# Patient Record
Sex: Male | Born: 1990 | Race: White | Hispanic: No | Marital: Single | Smoking: Never smoker
Health system: Southern US, Community
[De-identification: ages and names within clinical notes are randomized; demographics above are authoritative.]

---
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
## 2009-01-06 IMAGING — CT CT HEAD W/O CM
1 series · 16 of 30 positions shown, 20 images · non-contrast
Comparison: none

CLINICAL DATA: Migraine headaches increasing in frequency for the past 4-5 months.
HEAD CT WITHOUT CONTRAST ? 06/11/07:
TECHNIQUE: Contiguous axial images were obtained from the base of the skull through the vertex according to standard protocol without contrast.

[Series 2: head_seq 4.5 h37s st · axial · 0.46mm/px · z∈[-145,+3]mm · 16 of 36 slices shown, 20 images]
[im 2/36  brain]
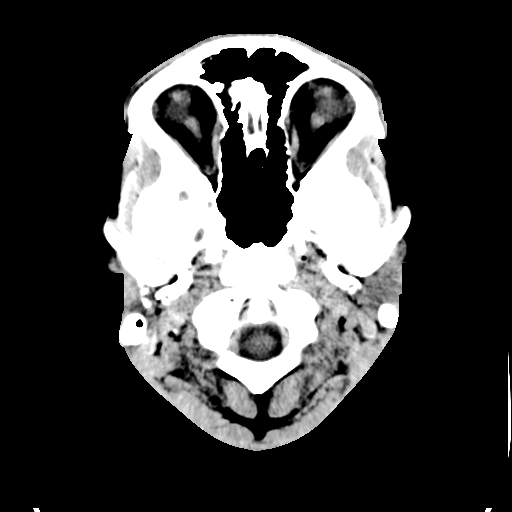
[im 2/36  bone]
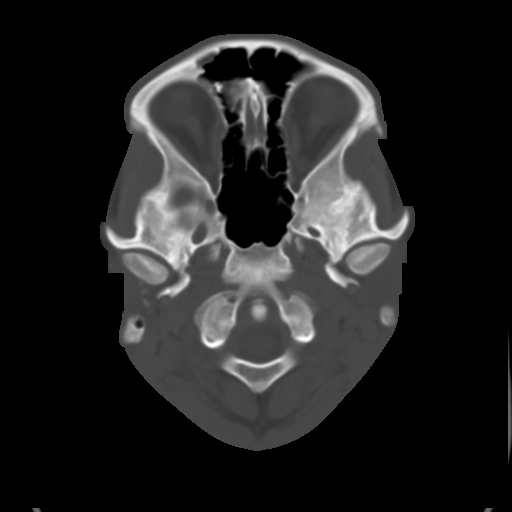
[im 4/36  brain]
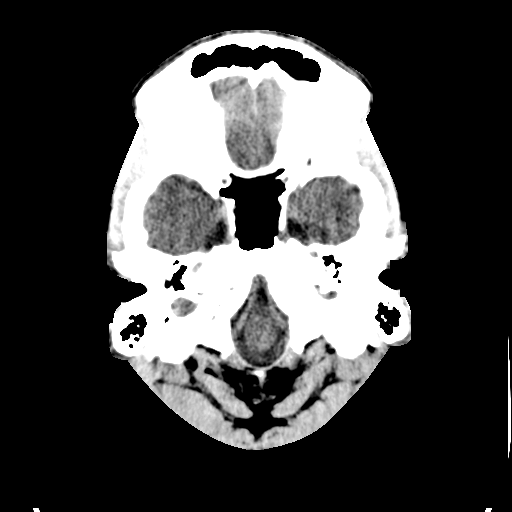
[im 7/36  brain]
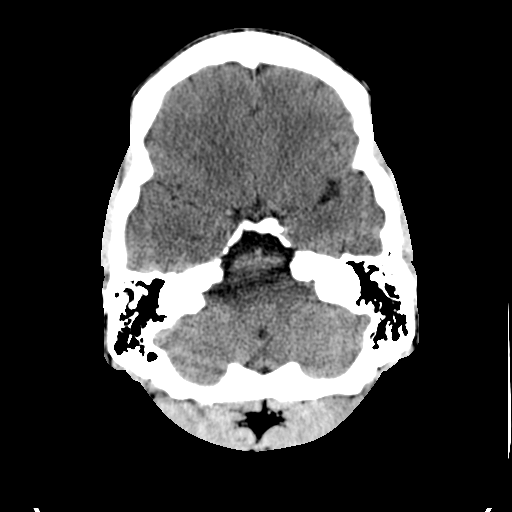
[im 9/36  brain]
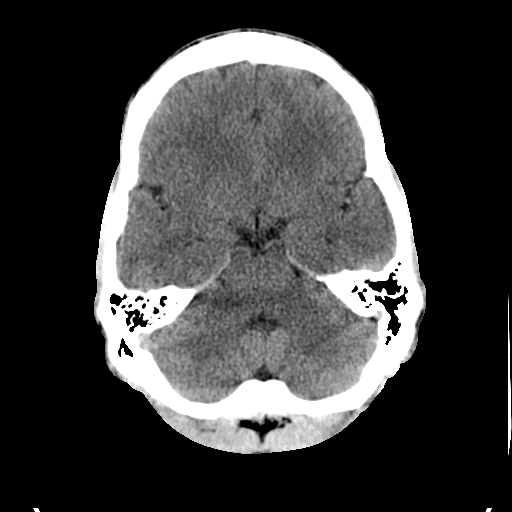
[im 10/36  brain]
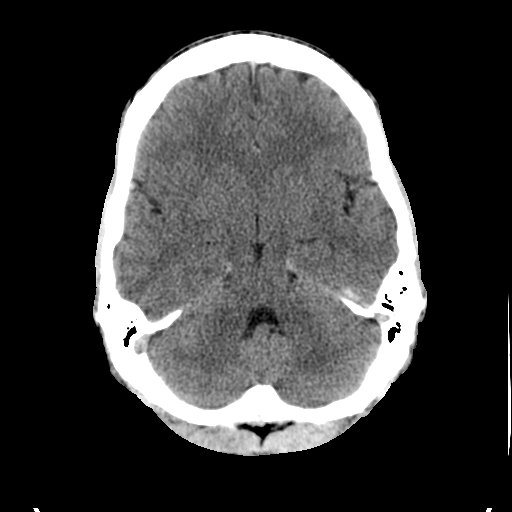
[im 10/36  bone]
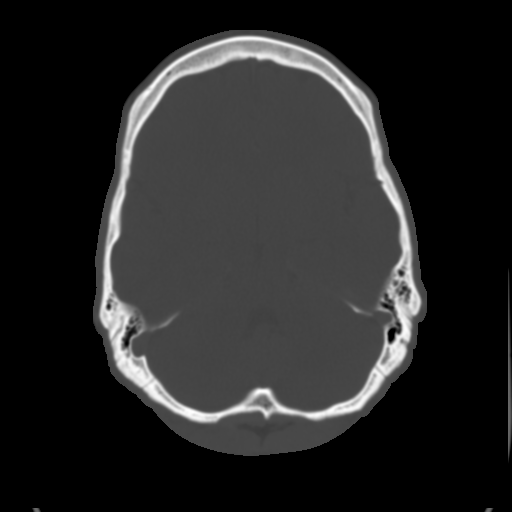
[im 13/36  brain]
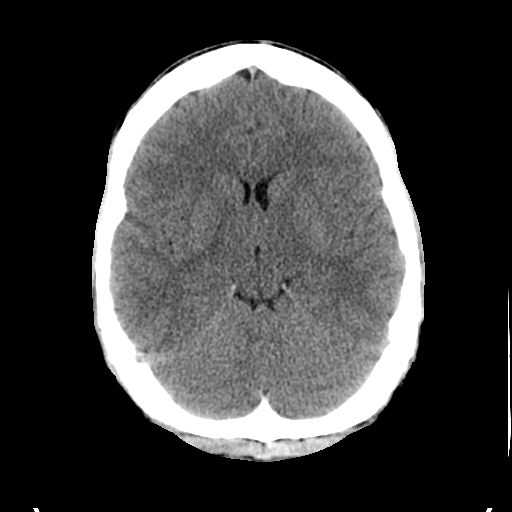
[im 15/36  brain]
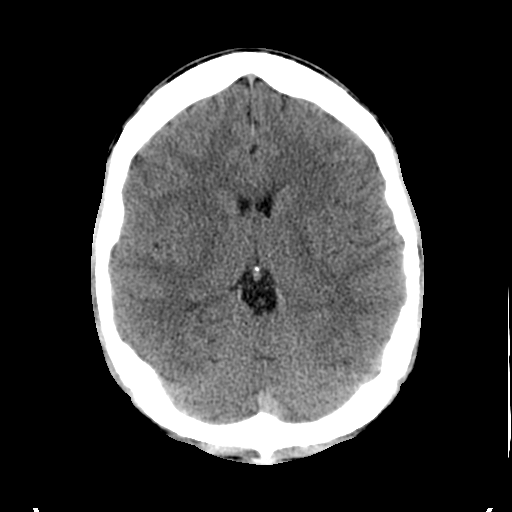
[im 17/36  brain]
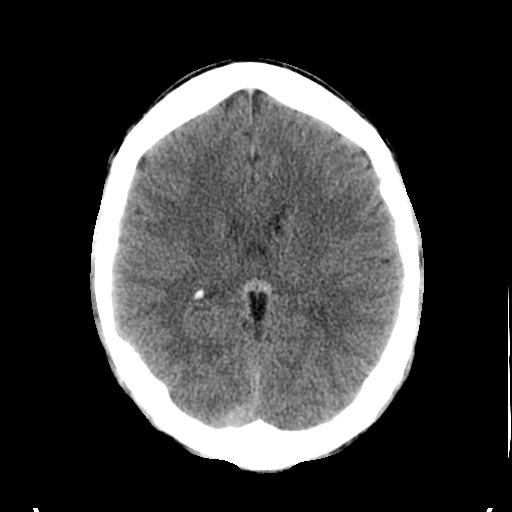
[im 19/36  brain]
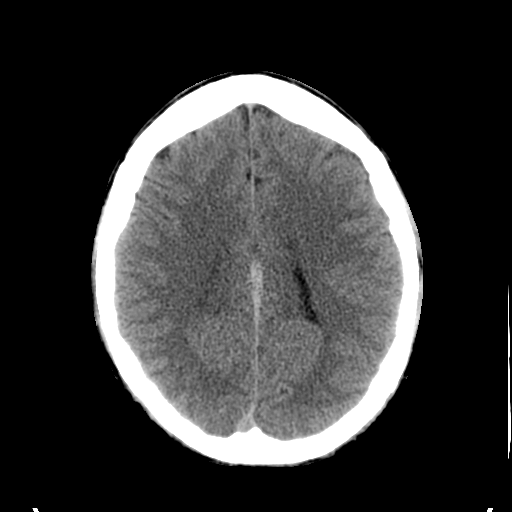
[im 19/36  bone]
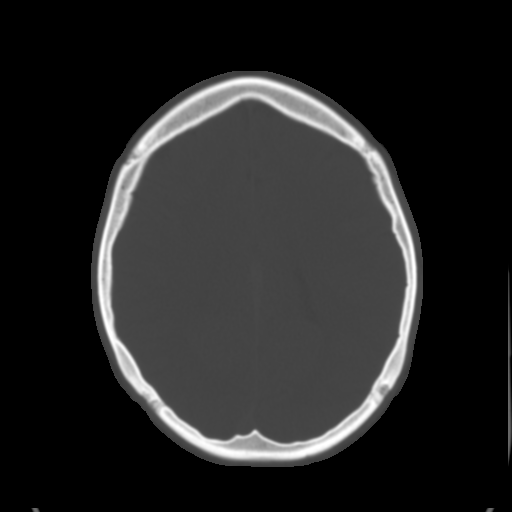
[im 21/36  brain]
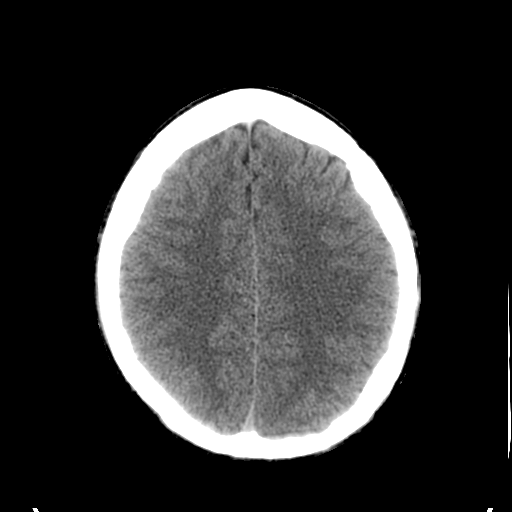
[im 23/36  brain]
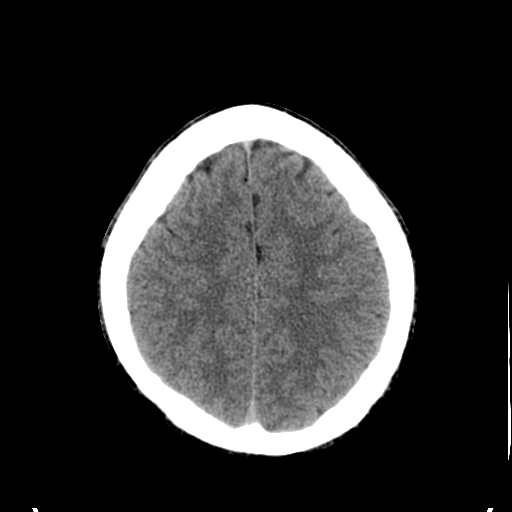
[im 26/36  brain]
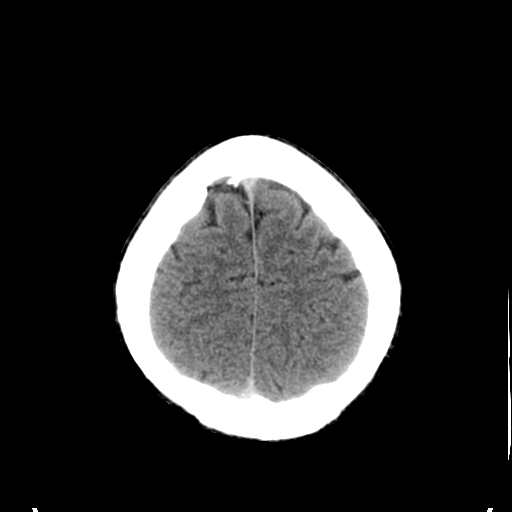
[im 27/36  brain]
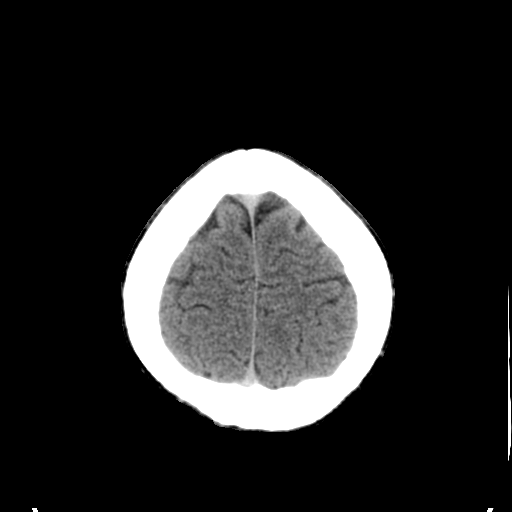
[im 27/36  bone]
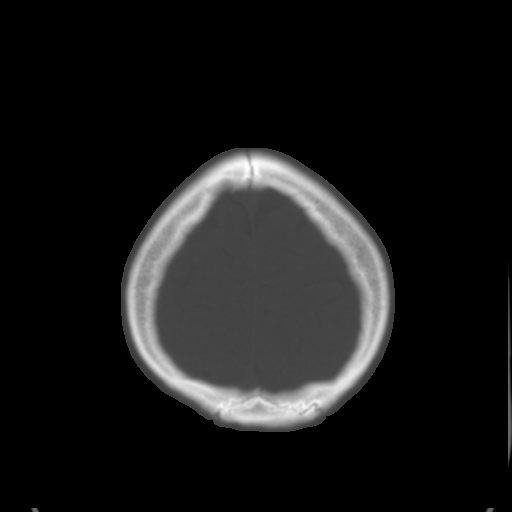
[im 29/36  brain]
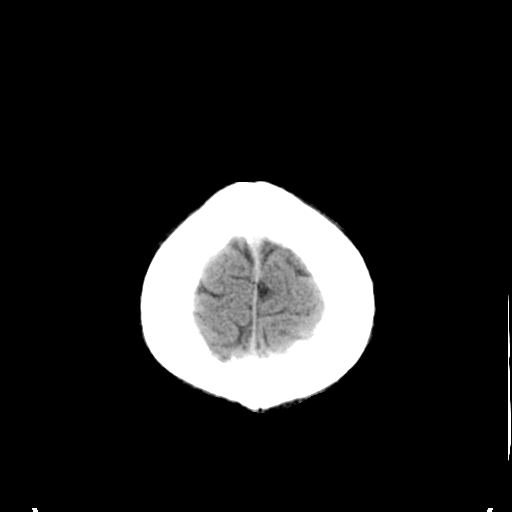
[im 32/36  brain]
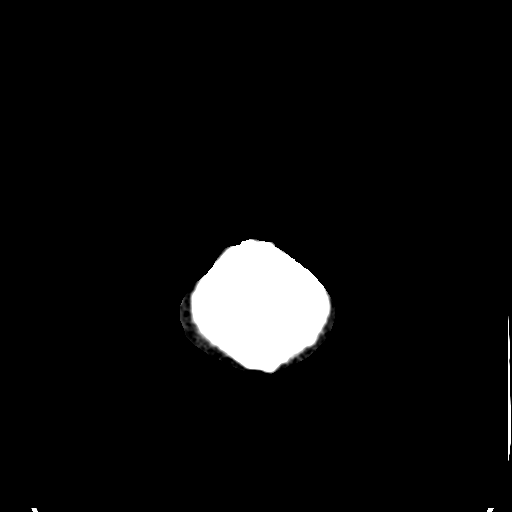
[im 34/36  brain]
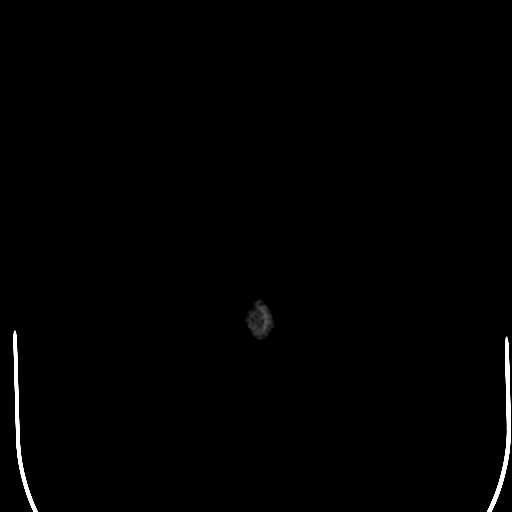

[16 of 30 positions shown; findings below may reference images not displayed]

FINDINGS: There is no evidence of intracranial hemorrhage, brain edema, acute infarct, mass lesion, or mass effect.  No other intra-axial abnormalities are seen, and the ventricles are within normal limits.  No abnormal extra-axial fluid collections or masses are identified.  No skull abnormalities are noted.
IMPRESSION: Negative non-contrast head CT.

## 2009-09-12 ENCOUNTER — Ambulatory Visit: Payer: Self-pay | Admitting: Gastroenterology

## 2009-11-08 ENCOUNTER — Encounter (INDEPENDENT_AMBULATORY_CARE_PROVIDER_SITE_OTHER): Payer: Self-pay | Admitting: *Deleted

## 2009-11-09 ENCOUNTER — Telehealth (INDEPENDENT_AMBULATORY_CARE_PROVIDER_SITE_OTHER): Payer: Self-pay | Admitting: *Deleted

## 2009-11-09 ENCOUNTER — Encounter (INDEPENDENT_AMBULATORY_CARE_PROVIDER_SITE_OTHER): Payer: Self-pay | Admitting: *Deleted

## 2009-11-10 ENCOUNTER — Ambulatory Visit: Payer: Self-pay | Admitting: Gastroenterology

## 2009-11-21 ENCOUNTER — Ambulatory Visit: Payer: Self-pay | Admitting: Gastroenterology

## 2010-05-31 ENCOUNTER — Ambulatory Visit: Payer: Self-pay | Admitting: Family Medicine

## 2010-05-31 DIAGNOSIS — H6690 Otitis media, unspecified, unspecified ear: Secondary | ICD-10-CM | POA: Insufficient documentation

## 2010-08-10 ENCOUNTER — Ambulatory Visit: Payer: Self-pay | Admitting: Family Medicine

## 2010-08-10 DIAGNOSIS — S0990XA Unspecified injury of head, initial encounter: Secondary | ICD-10-CM | POA: Insufficient documentation

## 2011-01-01 NOTE — Assessment & Plan Note (Signed)
Summary: sinus and ear inf/cjr   Vital Signs:  Patient profile:   20 year old male Weight:      172 pounds O2 Sat:      98 % Pulse rhythm:   regular BP sitting:   104 / 78  (left arm)  Vitals Entered By: Pura Spice, RN (May 31, 2010 2:28 PM) CC: Rt earache  allergies    History of Present Illness: complains throbbing disomfort rt ear over past week, increasing in severity no nasal congestion, no cough, no headache , no fever  Allergies (verified): No Known Drug Allergies  Past History:  Past Medical History: Last updated: 09/12/2009 asthma  Past Surgical History: Last updated: 09/12/2009 unremarkable  Social History: Last updated: 09/12/2009 Occupation: Student Patient has never smoked.  Alcohol Use - no Illicit Drug Use - no  Risk Factors: Smoking Status: never (09/12/2009)  Review of Systems      See HPI  The patient denies anorexia, fever, weight loss, weight gain, vision loss, decreased hearing, hoarseness, chest pain, syncope, dyspnea on exertion, peripheral edema, prolonged cough, headaches, hemoptysis, abdominal pain, melena, hematochezia, severe indigestion/heartburn, hematuria, incontinence, genital sores, muscle weakness, suspicious skin lesions, transient blindness, difficulty walking, depression, unusual weight change, abnormal bleeding, enlarged lymph nodes, angioedema, breast masses, and testicular masses.    Physical Exam  General:  Well-developed,well-nourished,in no acute distress; alert,appropriate and cooperative throughout examination Head:  Normocephalic and atraumatic without obvious abnormalities. No apparent alopecia or balding. Eyes:  No corneal or conjunctival inflammation noted. EOMI. Perrla. Funduscopic exam benign, without hemorrhages, exudates or papilledema. Vision grossly normal. Ears:  rt TM erythematous and dull, left ear negativce Nose:  External nasal examination shows no deformity or inflammation. Nasal mucosa are pink and  moist without lesions or exudates. Mouth:  Oral mucosa and oropharynx without lesions or exudates.  Teeth in good repair. Lungs:  Normal respiratory effort, chest expands symmetrically. Lungs are clear to auscultation, no crackles or wheezes. Heart:  Normal rate and regular rhythm. S1 and S2 normal without gallop, murmur, click, rub or other extra sounds.   Impression & Recommendations:  Problem # 1:  OTITIS MEDIA, ACUTE, RIGHT (ICD-382.9) Assessment New  His updated medication list for this problem includes:    Amoxicillin 500 Mg Caps (Amoxicillin) .Marland Kitchen... 1 stat then 1 morn, midaft and hs  Complete Medication List: 1)  Albuterol 90 Mcg/act Aers (Albuterol) .Marland Kitchen.. 1 or 2 inhalations every 4-6 as needed for wheezing 2)  Adderall Xr 30 Mg Xr24h-cap (Amphetamine-dextroamphetamine) 3)  Amoxicillin 500 Mg Caps (Amoxicillin) .Marland Kitchen.. 1 stat then 1 morn, midaft and hs  Patient Instructions: 1)  otitis media 2)  amoxicillin 500 mg three times a day Prescriptions: AMOXICILLIN 500 MG CAPS (AMOXICILLIN) 1 stat then 1 morn, midaft and hs  #30 x 0   Entered and Authorized by:   Judithann Sheen MD   Signed by:   Judithann Sheen MD on 05/31/2010   Method used:   [facility]        [facility] [address]       [address]       [place], [place]  [address]       Ph: [phone] or [phone]       Fax: [phone]   RxID:   [id number]
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
## 2011-07-02 ENCOUNTER — Encounter: Payer: Self-pay | Admitting: Family Medicine

## 2011-07-03 ENCOUNTER — Ambulatory Visit (INDEPENDENT_AMBULATORY_CARE_PROVIDER_SITE_OTHER): Payer: BC Managed Care – PPO | Admitting: Family Medicine

## 2011-07-03 ENCOUNTER — Encounter: Payer: Self-pay | Admitting: Family Medicine

## 2011-07-03 DIAGNOSIS — N63 Unspecified lump in unspecified breast: Secondary | ICD-10-CM

## 2011-07-03 DIAGNOSIS — F988 Other specified behavioral and emotional disorders with onset usually occurring in childhood and adolescence: Secondary | ICD-10-CM

## 2011-07-03 DIAGNOSIS — N6310 Unspecified lump in the right breast, unspecified quadrant: Secondary | ICD-10-CM

## 2011-07-03 NOTE — Patient Instructions (Signed)
You do have a mass under the right nipple. We will get a an ultrasound and then decide on plan of treatment

## 2011-07-03 NOTE — Progress Notes (Signed)
  Subjective:    Patient ID: Ricardo Thomas, male    DOB: 1991-03-11, 20 y.o.   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   HENT: Negative.   Eyes: Negative.   Respiratory: Negative.   Cardiovascular: Negative.   Gastrointestinal: Negative.   Genitourinary: Negative.   Musculoskeletal: Negative.   Skin: Negative.   Neurological: Negative.   Hematological: Negative.   Psychiatric/Behavioral: Negative.        Patient has ADD and is taking Adderall provided by psychotherapist wellness Zoloft for depression       Objective:   Physical Exam the patient is a well-developed well-nourished young white male Lamination right breast from nipple reveals under the skin and 1 cm in diameter mass which is movable and feels firm Aren't lungs reveal no abnormality is actually clear for lymphadenopathy        Assessment & Plan:  Mass right breast plan is to have ultrasound and then determine treatment plan Instructed to continue medications prescribed by Dr. Toni Arthurs

## 2011-07-04 ENCOUNTER — Ambulatory Visit
Admission: RE | Admit: 2011-07-04 | Discharge: 2011-07-04 | Disposition: A | Discharge status: Home / Self Care | Payer: BC Managed Care – PPO | Source: Ambulatory Visit | Attending: Family Medicine | Admitting: Family Medicine

## 2011-07-04 ENCOUNTER — Other Ambulatory Visit: Payer: Self-pay | Admitting: Family Medicine

## 2011-07-04 DIAGNOSIS — N6310 Unspecified lump in the right breast, unspecified quadrant: Secondary | ICD-10-CM

## 2014-08-31 ENCOUNTER — Encounter: Payer: Self-pay | Admitting: Gastroenterology

## 2015-04-12 ENCOUNTER — Encounter: Payer: Self-pay | Admitting: Gastroenterology

## 2016-09-07 ENCOUNTER — Encounter (HOSPITAL_COMMUNITY): Payer: Self-pay

## 2016-09-07 ENCOUNTER — Emergency Department (HOSPITAL_COMMUNITY)
Admission: EM | Admit: 2016-09-07 | Discharge: 2016-09-07 | Disposition: A | Discharge status: Home / Self Care | Payer: BLUE CROSS/BLUE SHIELD | Attending: Emergency Medicine | Admitting: Emergency Medicine

## 2016-09-07 DIAGNOSIS — W01198A Fall on same level from slipping, tripping and stumbling with subsequent striking against other object, initial encounter: Secondary | ICD-10-CM | POA: Insufficient documentation

## 2016-09-07 DIAGNOSIS — S60512A Abrasion of left hand, initial encounter: Secondary | ICD-10-CM | POA: Diagnosis not present

## 2016-09-07 DIAGNOSIS — Y929 Unspecified place or not applicable: Secondary | ICD-10-CM | POA: Insufficient documentation

## 2016-09-07 DIAGNOSIS — Y9301 Activity, walking, marching and hiking: Secondary | ICD-10-CM | POA: Insufficient documentation

## 2016-09-07 DIAGNOSIS — Y999 Unspecified external cause status: Secondary | ICD-10-CM | POA: Insufficient documentation

## 2016-09-07 DIAGNOSIS — S60511A Abrasion of right hand, initial encounter: Secondary | ICD-10-CM | POA: Diagnosis not present

## 2016-09-07 DIAGNOSIS — S01112A Laceration without foreign body of left eyelid and periocular area, initial encounter: Secondary | ICD-10-CM | POA: Insufficient documentation

## 2016-09-07 MED ORDER — IBUPROFEN 800 MG PO TABS: 800.0000 mg | ORAL_TABLET | Freq: Three times a day (TID) | ORAL | 0 refills | Status: AC

## 2016-09-07 MED ORDER — LIDOCAINE-EPINEPHRINE (PF) 1 %-1:200000 IJ SOLN
INTRAMUSCULAR | Status: AC
  Filled 2016-09-07: qty 30

## 2016-09-07 MED ORDER — MUPIROCIN CALCIUM 2 % EX CREA
TOPICAL_CREAM | Freq: Once | CUTANEOUS | Status: DC
  Filled 2016-09-07: qty 15

## 2016-09-07 MED ORDER — BACITRACIN ZINC 500 UNIT/GM EX OINT
TOPICAL_OINTMENT | CUTANEOUS | Status: AC
  Filled 2016-09-07: qty 0.9

## 2016-09-07 NOTE — ED Provider Notes (Signed)
[facility]-EMERGENCY DEPT Provider Note   CSN: [id number] Arrival date & time: 09/07/16  [id number]     History   Chief Complaint Chief Complaint  Patient presents with  . Facial Laceration    HPI Ricardo Thomas is a 25 y.o. male.  HPI  25 year old male presents for evaluation of facial injury. Patient report earlier this morning he was walking his 100 pound dog and accidentally tripped on pavement ground and fell forward striking his head against the pavement. He denies any loss of consciousness but did suffer a laceration to his left eyebrow. States pain is mild, nonradiating. He denies any significant headache, neck pain, or pain to his extremities. He denies any precipitating symptoms prior to the fall. He is up-to-date with tetanus. No pain with eye movement or limited eye movement, denies any vision changes, no nosebleeds, and no dental pain. No recent alcohol use. Patient states he fell several days prior when he slipped on wet pump and seeds after coughing, and suffered abrasions to both of his hands. Patient states he is not concerned about those injuries. He requesting specifically just to have his wound sutured.  History reviewed. No pertinent past medical history.  There are no active problems to display for this patient.   No past surgical history on file.     Home Medications    Prior to Admission medications   Medication Sig Start Date End Date Taking? Authorizing Provider  albuterol (VENTOLIN HFA) 108 (90 BASE) MCG/ACT inhaler Inhale 2 puffs into the lungs every 6 (six) hours as needed.      Historical Provider, MD  amphetamine-dextroamphetamine (ADDERALL XR, 30MG ,) 30 MG 24 hr capsule Take 30 mg by mouth every morning.      Historical Provider, MD  sertraline (ZOLOFT) 50 MG tablet Take 50 mg by mouth daily.      Historical Provider, MD    Family History Family History  Problem Relation Age of Onset  . Irritable bowel syndrome Mother   . Cancer Maternal Uncle     colon  cancer  . Cancer Paternal Aunt     colon cancer  . Diabetes Maternal Grandmother   . Heart disease Maternal Grandmother   . Irritable bowel syndrome Maternal Grandmother   . Cancer Maternal Grandfather     pancreatic cancer    Social History Social History  Substance Use Topics  . Smoking status: Never Smoker  . Smokeless tobacco: Never Used  . Alcohol use Yes     Comment: once a week     Allergies   Review of patient's allergies indicates no known allergies.   Review of Systems Review of Systems  All other systems reviewed and are negative.    Physical Exam Updated Vital Signs BP 120/83 (BP Location: Left Arm)   Pulse 96   Temp 98.3 F (36.8 C) (Oral)   Resp 16   SpO2 96%   Physical Exam  Constitutional: He appears well-developed and well-nourished. No distress.  Generally healthy male laying in bed in no acute discomfort.  HENT:  Head: Normocephalic.  3 cm laceration noted to left eyebrow not actively bleeding. Surrounding ecchymosis noted to left orbital region. Small abrasion noted to the bridge of nose. Tenderness all left lower lateral orbital rim on palpation without crepitus. No hemotympanum, no septal hematoma, no malocclusion, no midface tenderness  Eyes: Conjunctivae are normal.  Pupil is equal round and reactive to light, extraocular movements intact  Neck: Normal range of motion. Neck supple.  Neck  with full range of motion and nontender to palpation.  Cardiovascular: Normal rate and regular rhythm.   Pulmonary/Chest: Effort normal and breath sounds normal.  Abdominal: Soft. There is no tenderness.  Neurological: He is alert.  Skin: No rash noted.  Right hand: Abrasion noted to [facility] aspects of hand, nontender and noninfected Left hand: Abrasions noted to the dorsum of second third and fourth MCP, nontender with normal fingers range of motion.  Psychiatric: He has a normal mood and affect.  Nursing note and vitals reviewed.    ED Treatments /  Results  Labs (all labs ordered are listed, but only abnormal results are displayed) Labs Reviewed - No data to display  EKG  EKG Interpretation None       Radiology No results found.  Procedures Procedures (including critical care time)  LACERATION REPAIR Performed by: Fayrene Helper Authorized by: Fayrene Helper Consent: Verbal consent obtained. Risks and benefits: risks, benefits and alternatives were discussed Consent given by: patient Patient identity confirmed: provided demographic data Prepped and Draped in normal sterile fashion Wound explored  Laceration Location: L eyebrow  Laceration Length: 3cm  No Foreign Bodies seen or palpated  Anesthesia: local infiltration  Local anesthetic: lidocaine 2% w epinephrine  Anesthetic total: 2 ml  Irrigation method: syringe Amount of cleaning: standard  Skin closure: prolene 5.0  Number of sutures: 5  Technique: simple interrupted.   Patient tolerance: Patient tolerated the procedure well with no immediate complications.   Medications Ordered in ED Medications  lidocaine-EPINEPHrine (XYLOCAINE-EPINEPHrine) 1 %-1:200000 (PF) injection (not administered)  bacitracin 500 UNIT/GM ointment (not administered)     Initial Impression / Assessment and Plan / ED Course  I have reviewed the triage vital signs and the nursing notes.  Pertinent labs & imaging results that were available during my care of the patient were reviewed by me and considered in my medical decision making (see chart for details).  Clinical Course    BP 120/83 (BP Location: Left Arm)   Pulse 96   Temp 98.3 F (36.8 C) (Oral)   Resp 16   SpO2 96%    Final Clinical Impressions(s) / ED Diagnoses   Final diagnoses:  Laceration of left eyebrow, initial encounter    New Prescriptions New Prescriptions   IBUPROFEN (ADVIL,MOTRIN) 800 MG TABLET    Take 1 tablet (800 mg total) by mouth 3 (three) times daily.   10:48 AM Patient had a mechanical  fall, fell forward and suffered a laceration to his left eyebrow. He does have some tenderness along his orbital rim on the left side, I offer to obtain a maxillofacial CT scan the patient declined. He does not have any sign to suggest ocular muscle or nerve entrapment. No sign to suggest basilar skull fracture. No battle sign. He is up-to-date with tetanus. Plan to irrigate wound, and sutured appropriately.   Fayrene Helper, PA-C 09/07/16 1148    Marily Memos, MD 09/07/16 1435

## 2016-09-07 NOTE — ED Triage Notes (Signed)
He states that as he was walking his dog this morning, he tripped and "my head hit the pavement". He has abrasions at bridge of nose and lac. Just inferior to left lat. Eyebrow.

## 2016-09-07 NOTE — ED Notes (Signed)
Suture cart to bedside. 

## 2016-09-07 NOTE — Discharge Instructions (Signed)
Please have your sutures remove in 3 days at your primary care office or at urgent care.  Take ibuprofen as needed for pain.  Keep wound clean.  Return if you have any concern.

## 2016-09-25 ENCOUNTER — Encounter (HOSPITAL_COMMUNITY): Payer: Self-pay

## 2016-09-25 ENCOUNTER — Emergency Department (HOSPITAL_COMMUNITY)
Admission: EM | Admit: 2016-09-25 | Discharge: 2016-09-26 | Disposition: A | Discharge status: Home / Self Care | Payer: BLUE CROSS/BLUE SHIELD | Attending: Emergency Medicine | Admitting: Emergency Medicine

## 2016-09-25 DIAGNOSIS — F909 Attention-deficit hyperactivity disorder, unspecified type: Secondary | ICD-10-CM | POA: Insufficient documentation

## 2016-09-25 DIAGNOSIS — R51 Headache: Secondary | ICD-10-CM | POA: Insufficient documentation

## 2016-09-25 DIAGNOSIS — R569 Unspecified convulsions: Secondary | ICD-10-CM | POA: Diagnosis not present

## 2016-09-25 MED ORDER — SODIUM CHLORIDE 0.9 % IV BOLUS (SEPSIS)
1000.0000 mL | Freq: Once | INTRAVENOUS | Status: AC
  Administered 2016-09-25: 1000 mL via INTRAVENOUS

## 2016-09-25 MED ORDER — ONDANSETRON HCL 4 MG/2ML IJ SOLN
4.0000 mg | Freq: Once | INTRAMUSCULAR | Status: AC
  Administered 2016-09-25: 4 mg via INTRAVENOUS
  Filled 2016-09-25: qty 2

## 2016-09-25 NOTE — ED Provider Notes (Signed)
[facility]-EMERGENCY DEPT Provider Note   CSN: [id number] Arrival date & time: 09/25/16  2324  By signing my name below, I, Alyssa GroveMartin Green, attest that this documentation has been prepared under the direction and in the presence of Zadie Rhineonald Kenroy Timberman, MD. Electronically Signed: Alyssa GroveMartin Green, ED Scribe. 09/25/16. 11:46 PM.  History   Chief Complaint Chief Complaint  Patient presents with  . Seizures   No hx of seizures. Pt was feeling off today. Shaking shivering and chills. Pt was in his bed, having a sudden onset, grand mal type seizure onset 10:20 PM. Pt seized for approximately 4-5 minutes before self resolving. Pt had his eyes open, but was not respondent to voice during the seizure Pt was able to communicate shortly afterwards, but could not say "dad" and over time has started to get better. Per EMS, with any positional change, patient experienced severe nausea and dry heaves with occasional vomiting. No Zofran given by EMS. Pt had a blood sugar of 147. Occasionally had trouble forming words when speaking to EMS, but no serious problems with speech. He thinks current year is 2016. Per pt, he is currently experiencing a generalized, throbbing headache. Denies chest pain and abdominal pain.  Per family, pt takes 3 mg Adderall once daily for ADHD. Pt drinks alcohol occasionally and denies drug use. He does not take any supplements when he goes to the gym, but does take a vitamin. He reports a fall where he struck his head a few weeks ago. Hx of concussion at age 89.   The history is provided by the patient, a relative and the EMS personnel.    PMH  - ADHD Home Medications    Prior to Admission medications   Medication Sig Start Date End Date Taking? Authorizing Provider  albuterol (VENTOLIN HFA) 108 (90 BASE) MCG/ACT inhaler Inhale 2 puffs into the lungs every 6 (six) hours as needed.      Historical Provider, MD  amphetamine-dextroamphetamine (ADDERALL XR, 30MG ,) 30 MG 24 hr capsule Take 30 mg by  mouth every morning.      Historical Provider, MD  ibuprofen (ADVIL,MOTRIN) 800 MG tablet Take 1 tablet (800 mg total) by mouth 3 (three) times daily. 09/07/16   Fayrene HelperBowie Tran, PA-C  sertraline (ZOLOFT) 50 MG tablet Take 50 mg by mouth daily.      Historical Provider, MD    Family History Family History  Problem Relation Age of Onset  . Irritable bowel syndrome Mother   . Cancer Maternal Uncle     colon cancer  . Cancer Paternal Aunt     colon cancer  . Diabetes Maternal Grandmother   . Heart disease Maternal Grandmother   . Irritable bowel syndrome Maternal Grandmother   . Cancer Maternal Grandfather     pancreatic cancer    Social History Social History  Substance Use Topics  . Smoking status: Never Smoker  . Smokeless tobacco: Never Used  . Alcohol use Yes     Comment: once a week     Allergies   Review of patient's allergies indicates no known allergies.   Review of Systems Review of Systems  Constitutional: Negative for fever.  Eyes: Negative for visual disturbance.  Respiratory: Positive for shortness of breath.   Cardiovascular: Negative for chest pain.  Gastrointestinal: Positive for nausea and vomiting.  Neurological: Positive for seizures, speech difficulty (Slight with some words) and headaches.  All other systems reviewed and are negative.  Physical Exam Updated Vital Signs Ht 5\' 8"  (1.727 m)  Wt 210 lb (95.3 kg)   SpO2 95%   BMI 31.93 kg/m   Physical Exam  Nursing note and vitals reviewed. CONSTITUTIONAL: Disheveled, no distress noted HEAD: Normocephalic/atraumatic EYES: EOMI/PERRL ENMT: Mucous membranes moist, no tongue laceration NECK: supple no meningeal signs SPINE/BACK:entire spine nontender CV: S1/S2 noted, no murmurs/rubs/gallops noted LUNGS: Lungs are clear to auscultation bilaterally, no apparent distress ABDOMEN: soft, nontender, no rebound or guarding, bowel sounds noted throughout abdomen GU:no cva tenderness NEURO: Pt is  awake/alert, moves all extremitiesx4.  No facial droop. No arm or leg drift noted. Pt is mildly confused EXTREMITIES: pulses normal/equal, full ROM SKIN: warm, color normal PSYCH: no abnormalities of mood noted, alert and oriented to situation  ED Treatments / Results  DIAGNOSTIC STUDIES: Oxygen Saturation is 95% on RA, adequate by my interpretation.    COORDINATION OF CARE: 11:37 PM Discussed treatment plan with pt at bedside which includes CT Head and lab work including CBC and BMP and pt agreed to plan.  Labs (all labs ordered are listed, but only abnormal results are displayed) Labs Reviewed  BASIC METABOLIC PANEL - Abnormal; Notable for the following:       Result Value   Glucose, Bld 110 (*)    All other components within normal limits  CBC WITH DIFFERENTIAL/PLATELET - Abnormal; Notable for the following:    MCH 34.1 (*)    MCHC 36.5 (*)    All other components within normal limits  RAPID URINE DRUG SCREEN, HOSP PERFORMED - Abnormal; Notable for the following:    Benzodiazepines POSITIVE (*)    Amphetamines POSITIVE (*)    All other components within normal limits  ETHANOL    EKG  EKG Interpretation  Date/Time:  Wednesday September 25 2016 23:41:08 EDT Ventricular Rate:  82 PR Interval:    QRS Duration: 95 QT Interval:  371 QTC Calculation: 434 R Axis:   86 Text Interpretation:  Sinus rhythm Baseline wander in lead(s) V1 V2 No previous ECGs available Confirmed by Bebe Shaggy  MD, [place] Monds ([id number]) on 09/25/2016 11:43:42 PM       Radiology Ct Head Wo Contrast  Result Date: 09/26/2016 CLINICAL DATA:  Grand mal seizure at 10:20 p.m. EXAM: CT HEAD WITHOUT CONTRAST TECHNIQUE: Contiguous axial images were obtained from the base of the skull through the vertex without intravenous contrast. COMPARISON:  CT HEAD June 11, 2007 FINDINGS: BRAIN: The ventricles and sulci are normal. No intraparenchymal hemorrhage, mass effect nor midline shift. No acute large vascular territory  infarcts. No abnormal extra-axial fluid collections. Basal cisterns are patent. VASCULAR: Unremarkable. SKULL/SOFT TISSUES: No skull fracture. No significant soft tissue swelling. ORBITS/SINUSES: The included ocular globes and orbital contents are normal.The mastoid aircells and included paranasal sinuses are well-aerated. OTHER: None. IMPRESSION: Normal CT HEAD. Electronically Signed   By: Awilda Metro M.D.   On: 09/26/2016 00:56    Procedures Procedures (including critical care time)  Medications Ordered in ED Medications  sodium chloride 0.9 % bolus 1,000 mL (0 mLs Intravenous Stopped 09/26/16 0026)  ondansetron (ZOFRAN) injection 4 mg (4 mg Intravenous Given 09/25/16 2348)     Initial Impression / Assessment and Plan / ED Course  I have reviewed the triage vital signs and the nursing notes.  Pertinent labs & imaging results that were available during my care of the patient were reviewed by me and considered in my medical decision making (see chart for details).  Clinical Course   I personally performed the services described in this documentation, which was scribed  in my presence. The recorded information has been reviewed and is accurate.        3:15 AM Pt in the ED for new onset seizure He is back to baseline He is ambulatory He is taking PO fluids No focal weakness No dysarthria noted BP 121/58   Pulse 85   Temp 98.6 F (37 C) (Oral)   Resp 13   Ht 5\' 8"  (1.727 m)   Wt 95.3 kg   SpO2 97%   BMI 31.93 kg/m  Vitals appropriate Ct head negative Labs reassuring I feel he is appropriate for d/c home Given referral to neurology Advised strict return precautions - pt stays with parents and they will look out for him Advised no driving/swimming/bathing alone   Final Clinical Impressions(s) / ED Diagnoses   Final diagnoses:  Seizure [facility])    New Prescriptions New Prescriptions   No medications on file     Zadie Rhine, MD 09/26/16 [phone]

## 2016-09-25 NOTE — ED Triage Notes (Signed)
Pt from home with new onset seizures. Pt has one grandmal lasting about in bed

## 2016-09-26 ENCOUNTER — Emergency Department (HOSPITAL_COMMUNITY): Payer: BLUE CROSS/BLUE SHIELD

## 2016-09-26 LAB — CBC WITH DIFFERENTIAL/PLATELET
Basophils Absolute: 0 10*3/uL (ref 0.0–0.1)
Basophils Relative: 0 %
Eosinophils Absolute: 0.1 10*3/uL (ref 0.0–0.7)
Eosinophils Relative: 1 %
HEMATOCRIT: 45.2 % (ref 39.0–52.0)
Hemoglobin: 16.5 g/dL (ref 13.0–17.0)
LYMPHS PCT: 15 %
Lymphs Abs: 1.2 10*3/uL (ref 0.7–4.0)
MCH: 34.1 pg — ABNORMAL HIGH (ref 26.0–34.0)
MCHC: 36.5 g/dL — AB (ref 30.0–36.0)
MCV: 93.4 fL (ref 78.0–100.0)
MONO ABS: 0.7 10*3/uL (ref 0.1–1.0)
MONOS PCT: 8 %
NEUTROS ABS: 6.4 10*3/uL (ref 1.7–7.7)
Neutrophils Relative %: 76 %
Platelets: 304 10*3/uL (ref 150–400)
RBC: 4.84 MIL/uL (ref 4.22–5.81)
RDW: 12.1 % (ref 11.5–15.5)
WBC: 8.3 10*3/uL (ref 4.0–10.5)

## 2016-09-26 LAB — RAPID URINE DRUG SCREEN, HOSP PERFORMED
Amphetamines: POSITIVE — AB
BARBITURATES: NOT DETECTED
Benzodiazepines: POSITIVE — AB
Cocaine: NOT DETECTED
Opiates: NOT DETECTED
TETRAHYDROCANNABINOL: NOT DETECTED

## 2016-09-26 LAB — BASIC METABOLIC PANEL
ANION GAP: 12 (ref 5–15)
BUN: 9 mg/dL (ref 6–20)
CALCIUM: 9.4 mg/dL (ref 8.9–10.3)
CO2: 23 mmol/L (ref 22–32)
Chloride: 103 mmol/L (ref 101–111)
Creatinine, Ser: 1.01 mg/dL (ref 0.61–1.24)
GFR calc Af Amer: >60 mL/min (ref >60)
GFR calc non Af Amer: >60 mL/min (ref >60)
GLUCOSE: 110 mg/dL — AB (ref 65–99)
POTASSIUM: 3.8 mmol/L (ref 3.5–5.1)
Sodium: 138 mmol/L (ref 135–145)

## 2016-09-26 LAB — ETHANOL: Alcohol, Ethyl (B): <5 mg/dL (ref <5)

## 2016-09-26 NOTE — ED Notes (Signed)
Pt aware of need for urine  

## 2016-09-26 NOTE — ED Notes (Signed)
Pt given water 

## 2016-09-26 NOTE — ED Notes (Signed)
Pt ambulated in the hall no problems

## 2016-09-26 NOTE — ED Notes (Signed)
Patient transported to CT 

## 2016-09-26 NOTE — Discharge Instructions (Signed)
Please be aware you may have another seizure ° °Do not drive until seen by your physician for your condition ° °Do not climb ladders/roofs/trees as a seizure can occur at that height and cause serious harm ° °Do not bathe/swim alone as a seizure can occur and cause serious harm ° °Please followup with your physician or neurologist for further testing and possible treatment ° ° °

## 2016-10-08 ENCOUNTER — Ambulatory Visit: Payer: BLUE CROSS/BLUE SHIELD | Admitting: Diagnostic Neuroimaging

## 2018-04-24 IMAGING — CT CT HEAD W/O CM
2 of 4 series · 9 of 47 positions shown, 11 images · non-contrast
Comparison: CT HEAD June 11, 2007

CLINICAL DATA: Grand mal seizure at [DATE] p.m..

EXAM:
CT HEAD WITHOUT CONTRAST
TECHNIQUE: Contiguous axial images were obtained from the base of the skull
through the vertex without intravenous contrast.

[Series 205: coronals · coronal · 0.40mm/px · 6 of 64 slices shown, 8 images (1 of 2)]
[im 8/64  brain]
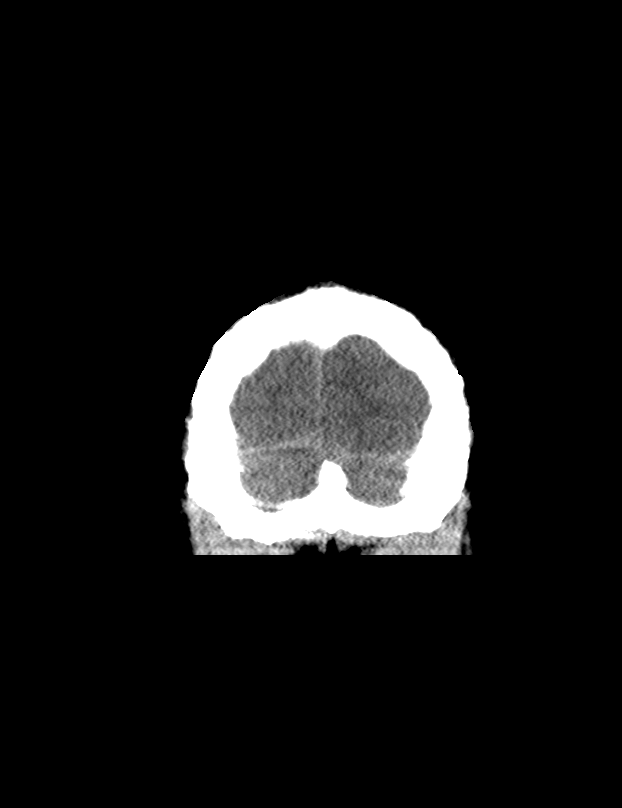
[im 8/64  bone]
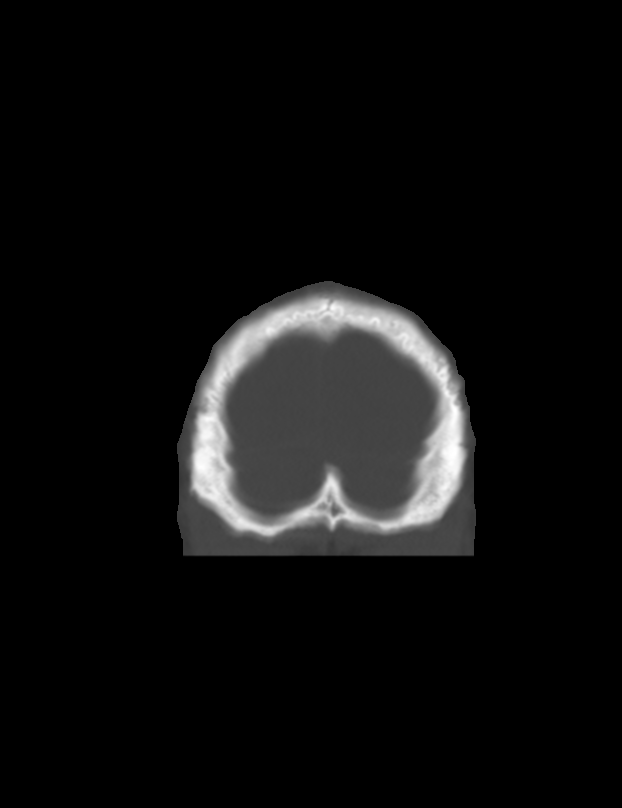
[im 22/64  brain]
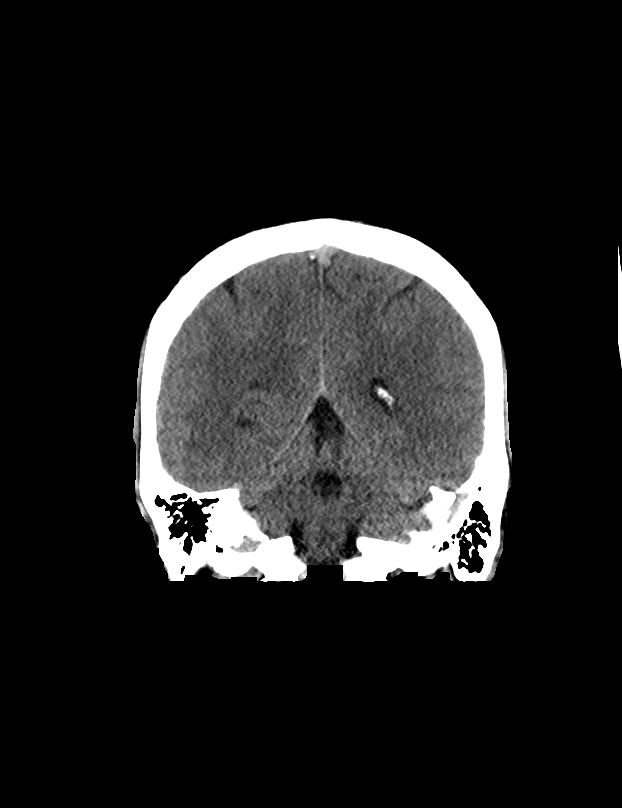
[im 29/64  brain]
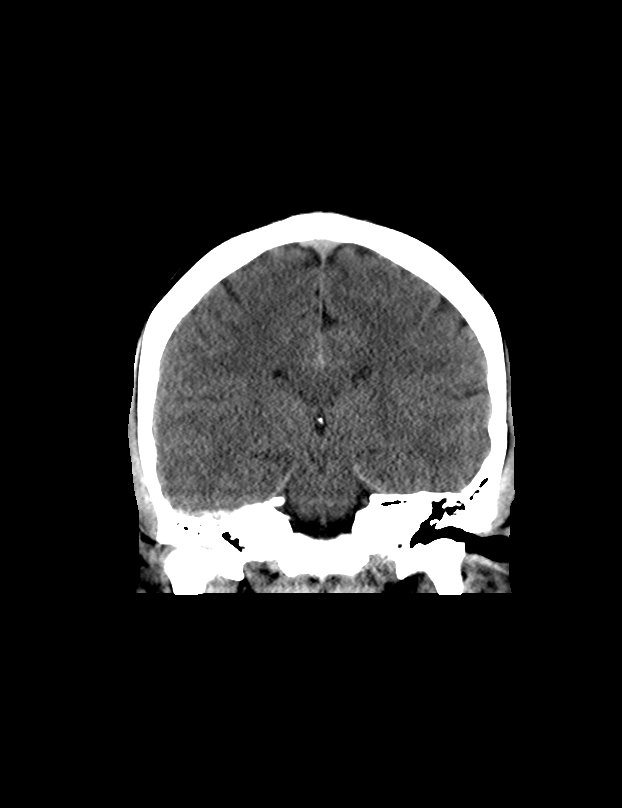
[im 36/64  brain]
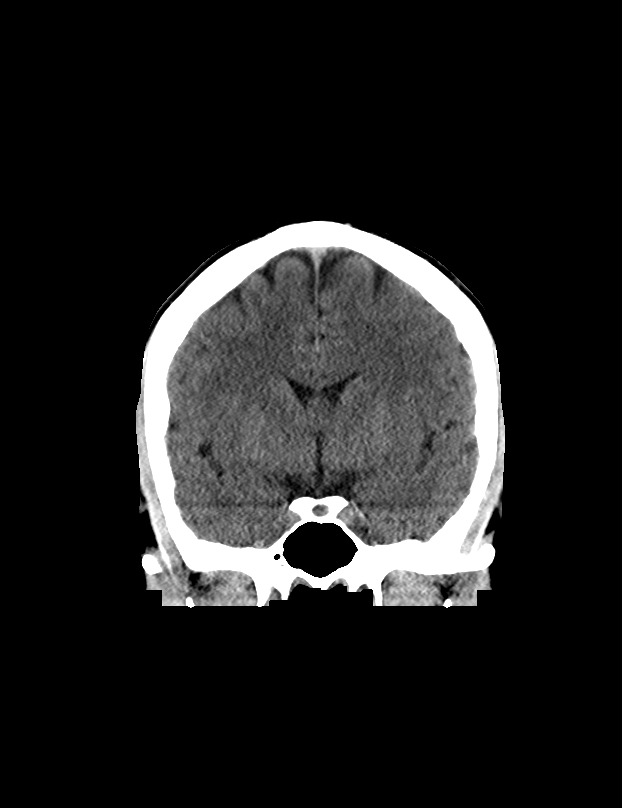
[im 50/64  brain]
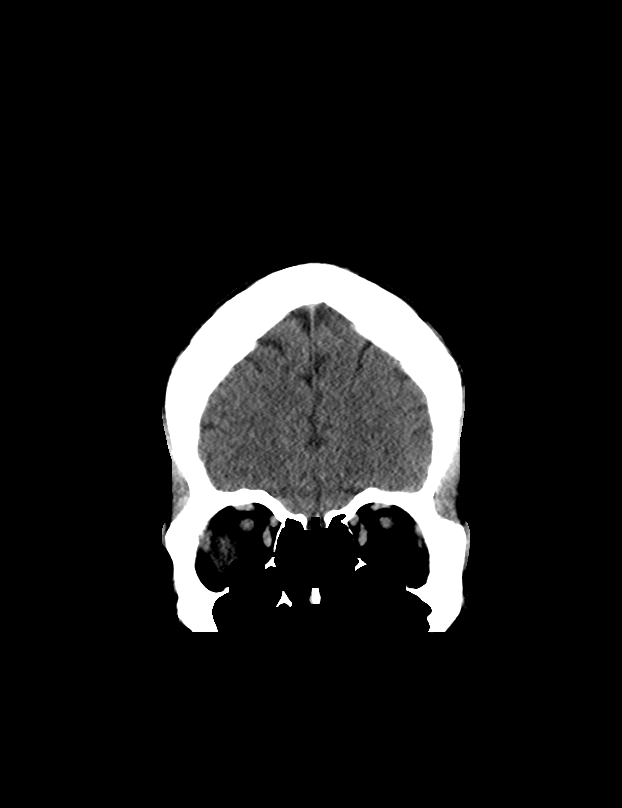
[im 50/64  bone]
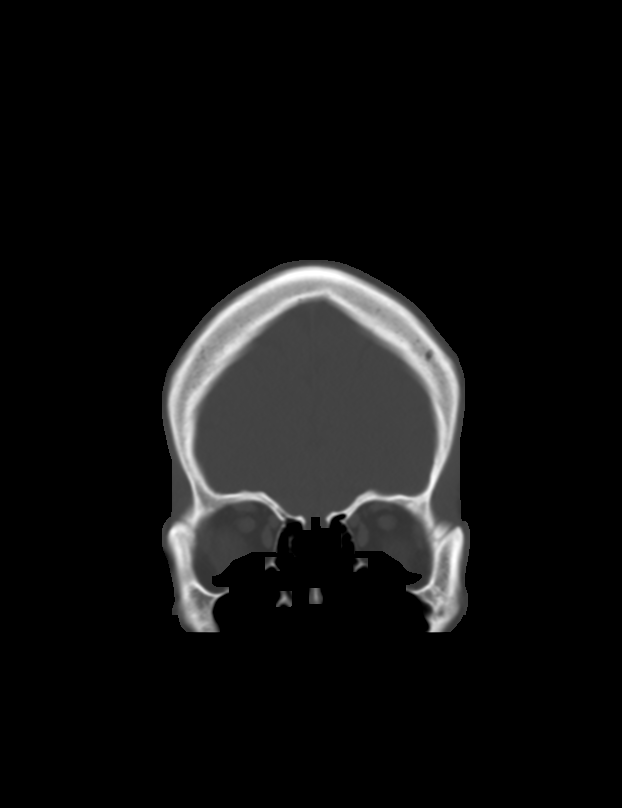
[im 57/64  brain]
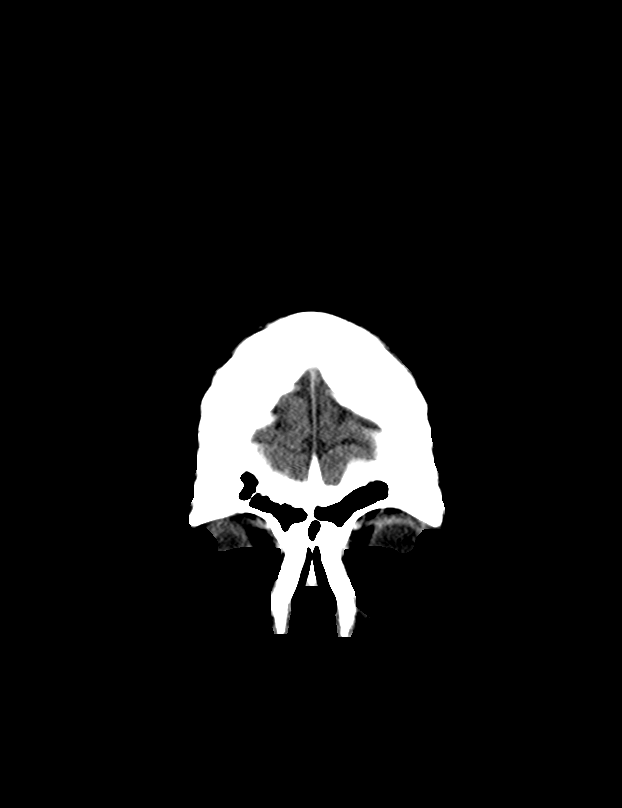

[Series 207: coronals · sagittal · 0.40mm/px · 3 of 51 slices shown (2 of 2)]
[im 17/51  brain]
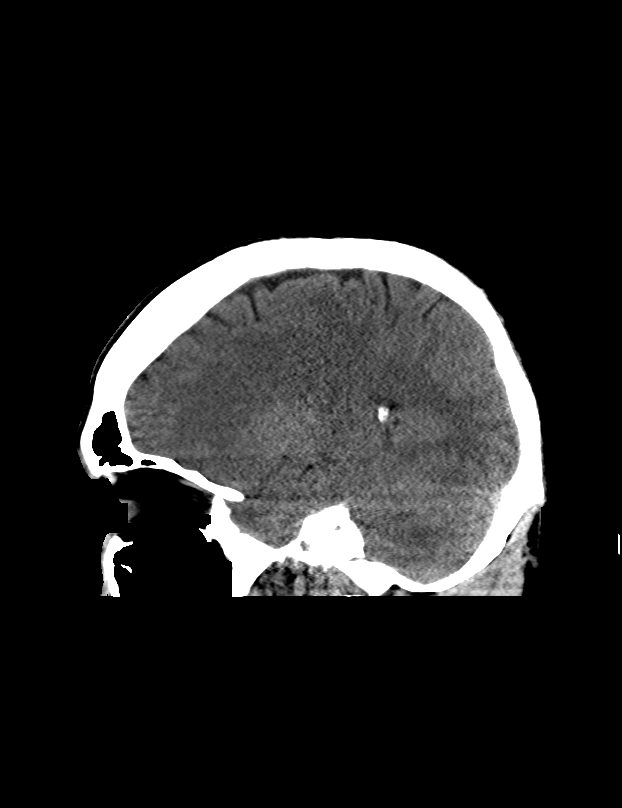
[im 26/51  brain]
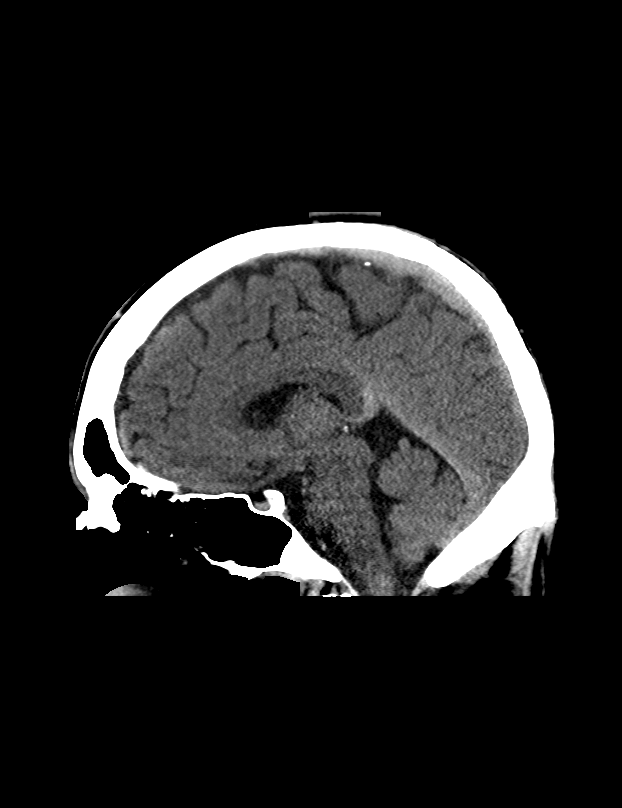
[im 34/51  brain]
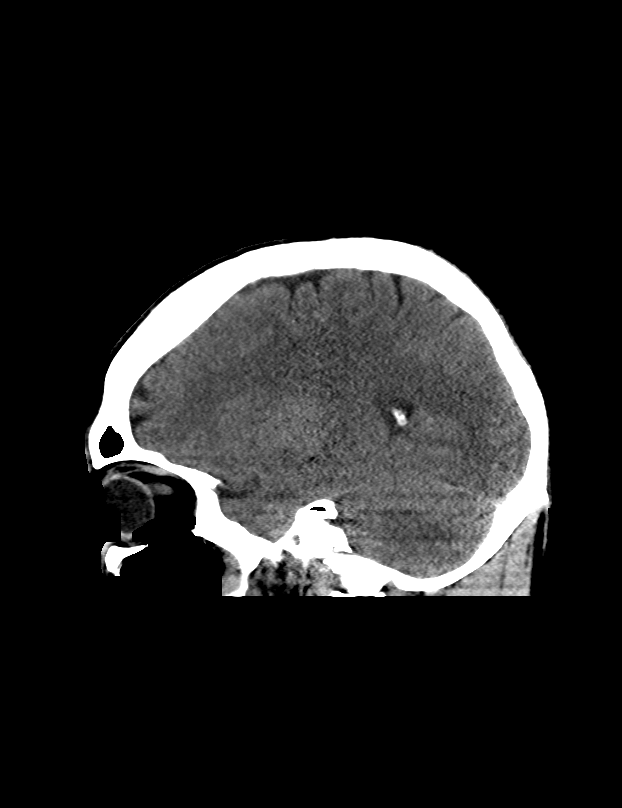

[9 of 47 positions shown; findings below may reference images not displayed]

FINDINGS: BRAIN: The ventricles and sulci are normal. No intraparenchymal
hemorrhage, mass effect nor midline shift. No acute large vascular
territory infarcts. No abnormal extra-axial fluid collections. Basal
cisterns are patent.

VASCULAR: Unremarkable.

SKULL/SOFT TISSUES: No skull fracture. No significant soft tissue
swelling.

ORBITS/SINUSES: The included ocular globes and orbital contents are
normal.The mastoid aircells and included paranasal sinuses are
well-aerated.

OTHER: None.
IMPRESSION: Normal CT HEAD.

## 2020-02-18 ENCOUNTER — Ambulatory Visit: Payer: Self-pay | Attending: Internal Medicine

## 2020-02-18 DIAGNOSIS — Z23 Encounter for immunization: Secondary | ICD-10-CM

## 2020-02-18 NOTE — Progress Notes (Signed)
   Covid-19 Vaccination Clinic  Name:  SALBADOR FIVEASH    MRN: [id number] DOB: [birth year]  02/18/2020  Mr. Menser was observed post Covid-19 immunization for 15 minutes without incident. He was provided with Vaccine Information Sheet and instruction to access the V-Safe system.   Mr. Reckner was instructed to call 911 with any severe reactions post vaccine: Marland Kitchen Difficulty breathing  . Swelling of face and throat  . A fast heartbeat  . A bad rash all over body  . Dizziness and weakness   Immunizations Administered    Name Date Dose VIS Date Route   Pfizer COVID-19 Vaccine 02/18/2020  2:09 PM 0.3 mL 11/12/2019 Intramuscular   Manufacturer: [employer], [employer]   Lot: [id number]   NDC: [address]-1

## 2020-03-14 ENCOUNTER — Ambulatory Visit: Payer: Self-pay | Attending: Internal Medicine

## 2020-03-14 DIAGNOSIS — Z23 Encounter for immunization: Secondary | ICD-10-CM

## 2020-03-14 NOTE — Progress Notes (Signed)
   Covid-19 Vaccination Clinic  Name:  Ricardo Thomas    MRN: [id number] DOB: 22-Oct-1991  03/14/2020  [name] was observed post Covid-19 immunization for 15 minutes without incident. He was provided with Vaccine Information Sheet and instruction to access the V-Safe system.   [name] was instructed to call 911 with any severe reactions post vaccine: Marland Kitchen Difficulty breathing  . Swelling of face and throat  . A fast heartbeat  . A bad rash all over body  . Dizziness and weakness   Immunizations Administered    Name Date Dose VIS Date Route   Pfizer COVID-19 Vaccine 03/14/2020  2:38 PM 0.3 mL 11/12/2019 Intramuscular   Manufacturer: [employer], [employer]   Lot: [id number]   NDC: [address]-4

## 2022-02-06 ENCOUNTER — Ambulatory Visit: Payer: Self-pay | Attending: Internal Medicine

## 2022-02-06 ENCOUNTER — Other Ambulatory Visit (HOSPITAL_BASED_OUTPATIENT_CLINIC_OR_DEPARTMENT_OTHER): Payer: Self-pay

## 2022-02-06 ENCOUNTER — Other Ambulatory Visit: Payer: Self-pay

## 2022-02-06 DIAGNOSIS — Z23 Encounter for immunization: Secondary | ICD-10-CM

## 2022-02-06 MED ORDER — PFIZER COVID-19 VAC BIVALENT 30 MCG/0.3ML IM SUSP
INTRAMUSCULAR | 0 refills | Status: AC
  Filled 2022-02-06: qty 0.3, 1d supply, fill #0

## 2022-02-08 ENCOUNTER — Other Ambulatory Visit (HOSPITAL_BASED_OUTPATIENT_CLINIC_OR_DEPARTMENT_OTHER): Payer: Self-pay

## 2022-02-08 NOTE — Progress Notes (Signed)
? ?  Covid-19 Vaccination Clinic ? ?Name:  Ricardo Thomas [facility]    ?MRN: [id number] ?DOB: 06/12/1991 ? ?02/08/2022 ? ?Ricardo Thomas was observed post Covid-19 immunization for 15 minutes without incident. He was provided with Vaccine Information Sheet and instruction to access the V-Safe system.  ? ?Ricardo Thomas was instructed to call 911 with any severe reactions post vaccine: ?Difficulty breathing  ?Swelling of face and throat  ?A fast heartbeat  ?A bad rash all over body  ?Dizziness and weakness  ? ?Immunizations Administered   ? ? Name Date Dose VIS Date Route  ? [employer] 02/06/2022  2:25 PM 0.3 mL 08/01/2021 Intramuscular  ? Manufacturer: [employer]  ? Lot: [id number]  ? NDC: [phone]  ? ?  ? ? ?

## 2022-06-17 ENCOUNTER — Other Ambulatory Visit (HOSPITAL_BASED_OUTPATIENT_CLINIC_OR_DEPARTMENT_OTHER): Payer: Self-pay

## 2024-08-11 ENCOUNTER — Other Ambulatory Visit (HOSPITAL_BASED_OUTPATIENT_CLINIC_OR_DEPARTMENT_OTHER): Payer: Self-pay

## 2024-08-11 MED ORDER — COVID-19 MRNA VAC-TRIS(PFIZER) 30 MCG/0.3ML IM SUSY
PREFILLED_SYRINGE | INTRAMUSCULAR | 0 refills | Status: AC
  Filled 2024-09-09: qty 0.3, 1d supply, fill #0

## 2024-08-11 MED ORDER — FLUZONE 0.5 ML IM SUSY
0.5000 mL | PREFILLED_SYRINGE | Freq: Once | INTRAMUSCULAR | 0 refills | Status: AC
  Filled 2024-08-11: qty 0.5, 1d supply, fill #0

## 2024-09-09 ENCOUNTER — Other Ambulatory Visit (HOSPITAL_BASED_OUTPATIENT_CLINIC_OR_DEPARTMENT_OTHER): Payer: Self-pay

## 2024-09-09 MED ORDER — COMIRNATY 30 MCG/0.3ML IM SUSY
0.3000 mL | PREFILLED_SYRINGE | Freq: Once | INTRAMUSCULAR | 0 refills | Status: AC
  Filled 2024-09-09: qty 0.3, 1d supply, fill #0
